# Patient Record
Sex: Female | Born: 1960 | Race: Black or African American | Hispanic: No | Marital: Single | Smoking: Former smoker
Health system: Southern US, Community
[De-identification: ages and names within clinical notes are randomized; demographics above are authoritative.]

---

## 2019-10-28 ENCOUNTER — Ambulatory Visit: Payer: Self-pay | Admitting: Internal Medicine

## 2019-12-11 ENCOUNTER — Other Ambulatory Visit: Payer: Self-pay

## 2019-12-11 ENCOUNTER — Encounter: Payer: Self-pay | Admitting: Internal Medicine

## 2019-12-11 ENCOUNTER — Ambulatory Visit (INDEPENDENT_AMBULATORY_CARE_PROVIDER_SITE_OTHER): Payer: 59 | Admitting: Internal Medicine

## 2019-12-11 DIAGNOSIS — D869 Sarcoidosis, unspecified: Secondary | ICD-10-CM

## 2019-12-11 NOTE — Patient Instructions (Signed)
Sarcoidosis ° °Sarcoidosis is a disease that can cause inflammation in many areas of the body. It most often affects the lungs (pulmonary sarcoidosis). Sarcoidosis can also affect the lymph nodes, liver, eyes, skin, heart, or any other body tissue. °Normally, cells that are part of your body's disease-fighting system (immune system) attack harmful substances (such as germs) in your body. This immune system response causes inflammation. After the harmful substance is destroyed, the inflammation and the immune cells go away. When you have sarcoidosis, your immune system causes inflammation even when there are no harmful substances, and the inflammation does not go away. Sarcoidosis also causes cells from your immune system to form small clumps of tissue (granulomas) in the affected area of your body. °What are the causes? °The exact cause of sarcoidosis is not known.  °It is possible that if you have a family history of this disease (genetic predisposition), the immune system response that leads to inflammation may be triggered by something in your environment, such as: °· Bacteria or viruses. °· Metals. °· Chemicals. °· Dust. °· Mold or mildew. °What increases the risk? °You may be at a greater risk for sarcoidosis if you: °· Have a family history of the disease. °· Are African-American. °· Are of Northern European descent. °· Are 20-50 years old. °· Work as a firefighter. °· Work in an environment where you are exposed to metals, chemicals, mold or mildew, or insecticides. °What are the signs or symptoms? °Some people with sarcoidosis have no symptoms. Others have very mild symptoms. The symptoms usually depend on the organ that is affected. Sarcoidosis most often affects the lungs, which may include symptoms such as: °· Chest pain. °· Coughing. °· Wheezing. °· Shortness of breath. °Other common symptoms include: °· Night sweats. °· Fever. °· Weight loss. °· Fatigue. °· Swollen lymph nodes. °· Joint pain. °How is  this diagnosed? °Sarcoidosis may be diagnosed based on: °· Your symptoms and medical history. °· A physical exam. °· Imaging tests to check for granulomas such as: °? Chest X-ray. °? CT scan. °? MRI. °? PET scan. °· Lung function tests. These tests evaluate your breathing and check for problems that may be related to sarcoidosis. °· A procedure to remove a tissue sample for testing (biopsy). You may have a biopsy of lung tissue if that is where you are having symptoms. °You may have tests to check for any complications of the condition. These tests may include: °· Eye exams. °· MRI of the heart or brain. °· Echocardiogram. °· Electrocardiogram (EKG or ECG). °How is this treated? °In some cases, sarcoidosis does not require a specific treatment because it causes no symptoms or mild symptoms. If your symptoms bother you or are severe, you may be prescribed medicines to reduce inflammation or relieve symptoms. These medicines may include: °· Prednisone. This is a steroid that reduces inflammation related to sarcoidosis. °· Hydroxychloroquine. This may be used to treat sarcoidosis that affects the skin, eyes, or brain. °· Methotrexate, leflunomide, or azathioprine. These medicines affect the immune system and can help with sarcoidosis in the joints, eyes, skin, or lungs. °· Medicines that you breathe in (inhalers). Inhalers can help you breathe if sarcoidosis affects your lungs. °Follow these instructions at home: ° °· Do not use any products that contain nicotine or tobacco, such as cigarettes and e-cigarettes. If you need help quitting, ask your health care provider. °· Avoid secondhand smoke and irritating dust or chemicals. Stay indoors on days when air quality is poor   in your area. °· Return to your normal activities as told by your health care provider. Ask your health care provider what activities are safe for you. °· Take or use over-the-counter and prescription medicines only as told by your health care  provider. °· Keep all follow-up visits as told by your health care provider. This is important. °Contact a health care provider if: °· You have vision problems. °· You have a dry cough that does not go away. °· You have an irregular heartbeat. °· You have pain or aches in your joints, hands, or feet. °· You have an unexplained rash. °Get help right away if: °· You have chest pain. °· You have difficulty breathing. °Summary °· Sarcoidosis is a disease that can cause inflammation in many body areas of the body. It most often affects the lungs (pulmonary sarcoidosis). It can also affect the lymph nodes, liver, eyes, skin, heart, or any other body tissue. °· When you have sarcoidosis, cells from your immune system form small clumps of tissue (granulomas) in the affected area of your body. °· Sarcoidosis sometimes does not require a specific treatment because it causes no symptoms or mild symptoms. °· If your symptoms bother you or are severe, you may be prescribed medicines to reduce inflammation or relieve symptoms. °This information is not intended to replace advice given to you by your health care provider. Make sure you discuss any questions you have with your health care provider. °Document Revised: 03/30/2017 Document Reviewed: 01/23/2017 °Elsevier Patient Education © 2020 [employer] ° °

## 2019-12-11 NOTE — Progress Notes (Signed)
HPI  Pt presents to the clinic today to establish care. She has not had a PCP in many years.  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 Managed by topical steroids and oral steroids. She has seen a dermatologist in the past for them.  Flu: never Tetanus: < 10 years ago Shingirx: never Covid: Moderna Pap Smear: > 5 years ago Mammogram: never Bone Density: never Colon Screening  History reviewed. No pertinent past medical history.  Current Outpatient Medications  Medication Sig Dispense Refill  . Biotin 78469 MCG TABS Take by mouth.    . COLLAGEN PO Take by mouth.    . Multiple Vitamin (MULTIVITAMIN) tablet Take 1 tablet by mouth daily.    . Omega 3 1200 MG CAPS Take by mouth.    . Turmeric (QC TUMERIC COMPLEX) 500 MG CAPS Take by mouth.     No current facility-administered medications for this visit.    Not on File  History reviewed. No pertinent family history.  Social History   Socioeconomic History  . Marital status: Single    Spouse name: Not on file  . Number of children: Not on file  . Years of education: Not on file  . Highest education level: Not on file  Occupational History  . Not on file  Tobacco Use  . Smoking status: Former Games developer  . Smokeless tobacco: Never Used  . Tobacco comment: quit 1999  Substance and Sexual Activity  . Alcohol use: Yes    Comment: occasional  . Drug use: Never  . Sexual activity: Not on file  Other Topics Concern  . Not on file  Social History Narrative  . Not on file   Social Determinants of Health   Financial Resource Strain:   . Difficulty of Paying Living Expenses:   Food Insecurity:   . Worried About Programme researcher, broadcasting/film/video in the Last Year:   . Barista in the Last Year:   Transportation Needs:   . Freight forwarder (Medical):   Marland Kitchen Lack of Transportation (Non-Medical):   Physical Activity:   . Days of Exercise per Week:   . Minutes of Exercise per Session:   Stress:   . Feeling of Stress  :   Social Connections:   . Frequency of Communication with Friends and Family:   . Frequency of Social Gatherings with Friends and Family:   . Attends Religious Services:   . Active Member of Clubs or Organizations:   . Attends Banker Meetings:   Marland Kitchen Marital Status:   Intimate Partner Violence:   . Fear of Current or Ex-Partner:   . Emotionally Abused:   Marland Kitchen Physically Abused:   . Sexually Abused:     ROS:  Constitutional: Denies fever, malaise, fatigue, headache or abrupt weight changes.  HEENT: Denies eye pain, eye redness, ear pain, ringing in the ears, wax buildup, runny nose, nasal congestion, bloody nose, or sore throat. Respiratory: Denies difficulty breathing, shortness of breath, cough or sputum production.   Cardiovascular: Denies chest pain, chest tightness, palpitations or swelling in the hands or feet.  Gastrointestinal: Denies abdominal pain, bloating, constipation, diarrhea or blood in the stool.  GU: Denies frequency, urgency, pain with urination, blood in urine, odor or discharge. Musculoskeletal: Denies decrease in range of motion, difficulty with gait, muscle pain or joint pain and swelling.  Skin: Denies redness, rashes, lesions or ulcercations.  Neurological: Denies dizziness, difficulty with memory, difficulty with speech or problems with balance and coordination.  Psych: Denies anxiety, depression, SI/HI.  No other specific complaints in a complete review of systems (except as listed in HPI above).  PE:  BP 116/78   Pulse 92   Temp 98.3 F (36.8 C) (Temporal)   Wt 121 lb (54.9 kg)   SpO2 97%  Wt Readings from Last 3 Encounters:  12/11/19 121 lb (54.9 kg)    General: Appears her stated age, well developed, well nourished in NAD. HEENT: Head: normal shape and size; Eyes: sclera white, no icterus, conjunctiva pink, PERRLA and EOMs intact;  Neck: Neck supple, trachea midline. No masses, lumps or thyromegaly present.  Cardiovascular: Normal  rate and rhythm. S1,S2 noted.  No murmur, rubs or gallops noted. No JVD or BLE edema.  Pulmonary/Chest: Normal effort and positive vesicular breath sounds. No respiratory distress. No wheezes, rales or ronchi noted.  Musculoskeletal: No difficulty with gait.  Neurological: Alert and oriented.  Psychiatric: Mood and affect normal. Behavior is normal. Judgment and thought content normal.     Assessment and Plan:

## 2019-12-11 NOTE — Assessment & Plan Note (Signed)
In remission Will monitor 

## 2020-02-05 ENCOUNTER — Encounter: Payer: 59 | Admitting: Internal Medicine

## 2020-04-13 ENCOUNTER — Ambulatory Visit (INDEPENDENT_AMBULATORY_CARE_PROVIDER_SITE_OTHER): Payer: 59 | Admitting: Internal Medicine

## 2020-04-13 ENCOUNTER — Encounter: Payer: Self-pay | Admitting: Internal Medicine

## 2020-04-13 ENCOUNTER — Other Ambulatory Visit (INDEPENDENT_AMBULATORY_CARE_PROVIDER_SITE_OTHER): Payer: 59

## 2020-04-13 ENCOUNTER — Other Ambulatory Visit (HOSPITAL_COMMUNITY)
Admission: RE | Admit: 2020-04-13 | Discharge: 2020-04-13 | Disposition: A | Discharge status: Home / Self Care | Payer: 59 | Source: Ambulatory Visit | Attending: Internal Medicine | Admitting: Internal Medicine

## 2020-04-13 ENCOUNTER — Other Ambulatory Visit: Payer: Self-pay

## 2020-04-13 VITALS — BP 120/80 | HR 90 | Temp 97.7°F | Ht 64.0 in | Wt 117.0 lb

## 2020-04-13 DIAGNOSIS — Z0000 Encounter for general adult medical examination without abnormal findings: Secondary | ICD-10-CM

## 2020-04-13 DIAGNOSIS — Z1159 Encounter for screening for other viral diseases: Secondary | ICD-10-CM | POA: Diagnosis not present

## 2020-04-13 DIAGNOSIS — Z1231 Encounter for screening mammogram for malignant neoplasm of breast: Secondary | ICD-10-CM | POA: Diagnosis not present

## 2020-04-13 DIAGNOSIS — Z124 Encounter for screening for malignant neoplasm of cervix: Secondary | ICD-10-CM | POA: Insufficient documentation

## 2020-04-13 DIAGNOSIS — Z1211 Encounter for screening for malignant neoplasm of colon: Secondary | ICD-10-CM

## 2020-04-13 DIAGNOSIS — R739 Hyperglycemia, unspecified: Secondary | ICD-10-CM

## 2020-04-13 DIAGNOSIS — Z113 Encounter for screening for infections with a predominantly sexual mode of transmission: Secondary | ICD-10-CM | POA: Diagnosis present

## 2020-04-13 DIAGNOSIS — Z114 Encounter for screening for human immunodeficiency virus [HIV]: Secondary | ICD-10-CM

## 2020-04-13 DIAGNOSIS — Z780 Asymptomatic menopausal state: Secondary | ICD-10-CM

## 2020-04-13 LAB — COMPREHENSIVE METABOLIC PANEL
ALT: 12 U/L (ref 0–35)
AST: 14 U/L (ref 0–37)
Albumin: 4.2 g/dL (ref 3.5–5.2)
Alkaline Phosphatase: 74 U/L (ref 39–117)
BUN: 7 mg/dL (ref 6–23)
CO2: 27 mEq/L (ref 19–32)
Calcium: 10 mg/dL (ref 8.4–10.5)
Chloride: 99 mEq/L (ref 96–112)
Creatinine, Ser: 0.87 mg/dL (ref 0.40–1.20)
GFR: 72.76 mL/min (ref >60.00)
Glucose, Bld: 404 mg/dL — ABNORMAL HIGH (ref 70–99)
Potassium: 3.7 mEq/L (ref 3.5–5.1)
Sodium: 135 mEq/L (ref 135–145)
Total Bilirubin: 0.9 mg/dL (ref 0.2–1.2)
Total Protein: 7.8 g/dL (ref 6.0–8.3)

## 2020-04-13 LAB — CBC
HCT: 43.1 % (ref 36.0–46.0)
Hemoglobin: 14.5 g/dL (ref 12.0–15.0)
MCHC: 33.7 g/dL (ref 30.0–36.0)
MCV: 92.9 fl (ref 78.0–100.0)
Platelets: 278 10*3/uL (ref 150.0–400.0)
RBC: 4.63 Mil/uL (ref 3.87–5.11)
RDW: 12.5 % (ref 11.5–15.5)
WBC: 8.8 10*3/uL (ref 4.0–10.5)

## 2020-04-13 LAB — LIPID PANEL
Cholesterol: 178 mg/dL (ref 0–200)
HDL: 66.2 mg/dL (ref >39.00)
LDL Cholesterol: 92 mg/dL (ref 0–99)
NonHDL: 111.76
Total CHOL/HDL Ratio: 3
Triglycerides: 97 mg/dL (ref 0.0–149.0)
VLDL: 19.4 mg/dL (ref 0.0–40.0)

## 2020-04-13 LAB — HEMOGLOBIN A1C: Hgb A1c MFr Bld: 15 % — ABNORMAL HIGH (ref 4.6–6.5)

## 2020-04-13 NOTE — Patient Instructions (Signed)

## 2020-04-13 NOTE — Progress Notes (Signed)
Subjective:    Patient ID: Laura Mayer, female    DOB: 1960/11/24, 59 y.o.   MRN: [id number]  HPI  Pt presents to the clinic today for her annual exam.  Flu: never Tetanus: < 10 years ago Covid: Moderna Shingrix: never Pap Smear: > 5 years Mammogram: never Bone Density: never Colon Screening: never Vision Screening: as needed Dentist: as needed  Diet: She does eat meat. She consumes fruits and veggies daily. She tries to avoid fried foods. She drinks mostly water. Exercise: None  Review of Systems      No past medical history on file.  Current Outpatient Medications  Medication Sig Dispense Refill  . Biotin 05397 MCG TABS Take by mouth.    . COLLAGEN PO Take by mouth.    . Multiple Vitamin (MULTIVITAMIN) tablet Take 1 tablet by mouth daily.    . Omega 3 1200 MG CAPS Take by mouth.    . Turmeric (QC TUMERIC COMPLEX) 500 MG CAPS Take by mouth.     No current facility-administered medications for this visit.    Not on File  Family History  Problem Relation Age of Onset  . Cancer Father        prostate    Social History   Socioeconomic History  . Marital status: Single    Spouse name: Not on file  . Number of children: Not on file  . Years of education: Not on file  . Highest education level: Not on file  Occupational History  . Not on file  Tobacco Use  . Smoking status: Former Games developer  . Smokeless tobacco: Never Used  . Tobacco comment: quit 1999  Substance and Sexual Activity  . Alcohol use: Yes    Comment: occasional  . Drug use: Never  . Sexual activity: Not on file  Other Topics Concern  . Not on file  Social History Narrative  . Not on file   Social Determinants of Health   Financial Resource Strain: Not on file  Food Insecurity: Not on file  Transportation Needs: Not on file  Physical Activity: Not on file  Stress: Not on file  Social Connections: Not on file  Intimate Partner Violence: Not on file     Constitutional: Denies  fever, malaise, fatigue, headache or abrupt weight changes.  HEENT: Denies eye pain, eye redness, ear pain, ringing in the ears, wax buildup, runny nose, nasal congestion, bloody nose, or sore throat. Respiratory: Denies difficulty breathing, shortness of breath, cough or sputum production.   Cardiovascular: Denies chest pain, chest tightness, palpitations or swelling in the hands or feet.  Gastrointestinal: Denies abdominal pain, bloating, constipation, diarrhea or blood in the stool.  GU: Denies urgency, frequency, pain with urination, burning sensation, blood in urine, odor or discharge. Musculoskeletal: Denies decrease in range of motion, difficulty with gait, muscle pain or joint pain and swelling.  Skin: Denies redness, rashes, lesions or ulcercations.  Neurological: Denies dizziness, difficulty with memory, difficulty with speech or problems with balance and coordination.  Psych: Denies anxiety, depression, SI/HI.  No other specific complaints in a complete review of systems (except as listed in HPI above).  Objective:   Physical Exam   BP 120/80   Pulse 90   Temp 97.7 F (36.5 C) (Temporal)   Ht 5\' 4"  (1.626 m)   Wt 117 lb (53.1 kg)   SpO2 98%   BMI 20.08 kg/m   Wt Readings from Last 3 Encounters:  12/11/19 121 lb (54.9 kg)  General: Appears her stated age, well developed, well nourished in NAD. Skin: Warm, dry and intact. No rashes noted. HEENT: Head: normal shape and size; Eyes: sclera white, no icterus, conjunctiva pink, PERRLA and EOMs intact;  Neck:  Neck supple, trachea midline. No masses, lumps or thyromegaly present.  Cardiovascular: Normal rate and rhythm. S1,S2 noted.  No murmur, rubs or gallops noted. No JVD or BLE edema. No carotid bruits noted. Pulmonary/Chest: Normal effort and positive vesicular breath sounds. No respiratory distress. No wheezes, rales or ronchi noted.  Abdomen: Soft and nontender. Normal bowel sounds. No distention or masses noted.  Liver, spleen and kidneys non palpable. Pelvic: Normal female anatomy. Cervix not well visualized. No discharge or odor noted. Adnexa nonpalpable. Musculoskeletal: Strength 5/5 BUE/BLE. No difficulty with gait.  Neurological: Alert and oriented. Cranial nerves II-XII grossly intact. Coordination normal.  Psychiatric: Mood and affect normal. Behavior is normal. Judgment and thought content normal.       Assessment & Plan:  Preventative Health Maintenance:  She declines flu shot today Tetanus UTD per her report Covid vaccine UTD She will consider Shingrix vaccine Pap smear today with STD screening Mammogram ordered- she will call to schedule Bone density ordered- she will call to schedule Referral to GI for screening colonoscopy Encouraged her to consume a balanced diet and exercise regimen Advised her to see an eye doctor and dentist annually Will check CBC, CMET, Lipid, HIV and Hep C today  RTC in 1 year, sooner if needed  Nicki Reaper, NP This visit occurred during the SARS-CoV-2 public health emergency.  Safety protocols were in place, including screening questions prior to the visit, additional usage of staff PPE, and extensive cleaning of exam room while observing appropriate contact time as indicated for disinfecting solutions.

## 2020-04-13 NOTE — Addendum Note (Signed)
Addended by: Roena Malady on: 04/13/2020 05:02 PM   Modules accepted: Orders

## 2020-04-14 LAB — HIV ANTIBODY (ROUTINE TESTING W REFLEX): HIV 1&2 Ab, 4th Generation: NONREACTIVE

## 2020-04-14 LAB — HEPATITIS C ANTIBODY
Hepatitis C Ab: NONREACTIVE
SIGNAL TO CUT-OFF: 0 (ref <1.00)

## 2020-04-16 ENCOUNTER — Encounter: Payer: Self-pay | Admitting: Internal Medicine

## 2020-04-16 LAB — CYTOLOGY - PAP
Chlamydia: NEGATIVE
Comment: NEGATIVE
Comment: NEGATIVE
Comment: NEGATIVE
Comment: NORMAL
Diagnosis: NEGATIVE
High risk HPV: NEGATIVE
Neisseria Gonorrhea: NEGATIVE
Trichomonas: NEGATIVE

## 2020-04-27 ENCOUNTER — Other Ambulatory Visit: Payer: Self-pay

## 2020-04-27 ENCOUNTER — Ambulatory Visit
Admission: RE | Admit: 2020-04-27 | Discharge: 2020-04-27 | Disposition: A | Discharge status: Home / Self Care | Payer: 59 | Source: Ambulatory Visit | Attending: Internal Medicine | Admitting: Internal Medicine

## 2020-04-27 DIAGNOSIS — Z1231 Encounter for screening mammogram for malignant neoplasm of breast: Secondary | ICD-10-CM

## 2020-04-27 DIAGNOSIS — Z780 Asymptomatic menopausal state: Secondary | ICD-10-CM

## 2020-04-29 ENCOUNTER — Other Ambulatory Visit: Payer: Self-pay

## 2020-04-29 ENCOUNTER — Encounter: Payer: Self-pay | Admitting: Internal Medicine

## 2020-04-29 ENCOUNTER — Ambulatory Visit (INDEPENDENT_AMBULATORY_CARE_PROVIDER_SITE_OTHER): Payer: 59 | Admitting: Internal Medicine

## 2020-04-29 VITALS — BP 122/72 | HR 79 | Temp 98.2°F | Wt 115.0 lb

## 2020-04-29 DIAGNOSIS — E1165 Type 2 diabetes mellitus with hyperglycemia: Secondary | ICD-10-CM

## 2020-04-29 LAB — GLUCOSE, POCT (MANUAL RESULT ENTRY): POC Glucose: 446 mg/dl — AB (ref 70–99)

## 2020-04-29 MED ORDER — METFORMIN HCL 1000 MG PO TABS: ORAL_TABLET | ORAL | 2 refills | Status: DC

## 2020-04-29 NOTE — Progress Notes (Signed)
Subjective:    Patient ID: Laura Mayer, female    DOB: 1960-05-06, 59 y.o.   MRN: [id number]  HPI  Pt presents to the clinic today for follow up labs. Recent A1C 15%, no prior history of DM2. Her LDL is 92.   Review of Systems  No past medical history on file.  Current Outpatient Medications  Medication Sig Dispense Refill  . Biotin 87867 MCG TABS Take by mouth.    . COLLAGEN PO Take by mouth.    . Multiple Vitamin (MULTIVITAMIN) tablet Take 1 tablet by mouth daily.    . Omega 3 1200 MG CAPS Take by mouth.    . Turmeric 500 MG CAPS Take by mouth.     No current facility-administered medications for this visit.    No Known Allergies  Family History  Problem Relation Age of Onset  . Cancer Father        prostate    Social History   Socioeconomic History  . Marital status: Single    Spouse name: Not on file  . Number of children: Not on file  . Years of education: Not on file  . Highest education level: Not on file  Occupational History  . Not on file  Tobacco Use  . Smoking status: Former Games developer  . Smokeless tobacco: Never Used  . Tobacco comment: quit 1999  Substance and Sexual Activity  . Alcohol use: Yes    Comment: occasional  . Drug use: Never  . Sexual activity: Not on file  Other Topics Concern  . Not on file  Social History Narrative  . Not on file   Social Determinants of Health   Financial Resource Strain: Not on file  Food Insecurity: Not on file  Transportation Needs: Not on file  Physical Activity: Not on file  Stress: Not on file  Social Connections: Not on file  Intimate Partner Violence: Not on file     Constitutional: Denies fever, malaise, fatigue, headache or abrupt weight changes.  Respiratory: Denies difficulty breathing, shortness of breath, cough or sputum production.   Cardiovascular: Denies chest pain, chest tightness, palpitations or swelling in the hands or feet.  Gastrointestinal: Denies abdominal pain, bloating,  constipation, diarrhea or blood in the stool.  Skin: Denies redness, rashes, lesions or ulcercations.  Neurological: Denies dizziness, difficulty with memory, difficulty with speech or problems with balance and coordination.    No other specific complaints in a complete review of systems (except as listed in HPI above).     Objective:   Physical Exam   BP 122/72   Pulse 79   Temp 98.2 F (36.8 C) (Temporal)   Wt 115 lb (52.2 kg)   SpO2 97%   BMI 19.74 kg/m   Wt Readings from Last 3 Encounters:  04/13/20 117 lb (53.1 kg)  12/11/19 121 lb (54.9 kg)    General: Appears her stated age, well developed, well nourished in NAD. Skin: Warm, dry and intact. No ulcerations noted. Cardiovascular: Normal rate. Pulmonary/Chest: Normal effort. Neurological: Alert and oriented.    BMET    Component Value Date/Time   NA 135 04/13/2020 0958   K 3.7 04/13/2020 0958   CL 99 04/13/2020 0958   CO2 27 04/13/2020 0958   GLUCOSE 404 (H) 04/13/2020 0958   BUN 7 04/13/2020 0958   CREATININE 0.87 04/13/2020 0958   CALCIUM 10.0 04/13/2020 0958    Lipid Panel     Component Value Date/Time   CHOL 178 04/13/2020 0958  TRIG 97.0 04/13/2020 0958   HDL 66.20 04/13/2020 0958   CHOLHDL 3 04/13/2020 0958   VLDL 19.4 04/13/2020 0958   LDLCALC 92 04/13/2020 0958    CBC    Component Value Date/Time   WBC 8.8 04/13/2020 0958   RBC 4.63 04/13/2020 0958   HGB 14.5 04/13/2020 0958   HCT 43.1 04/13/2020 0958   PLT 278.0 04/13/2020 0958   MCV 92.9 04/13/2020 0958   MCHC 33.7 04/13/2020 0958   RDW 12.5 04/13/2020 0958    Hgb A1C Lab Results  Component Value Date   HGBA1C 15.0 (H) 04/13/2020           Assessment & Plan:    Nicki Reaper, NP This visit occurred during the SARS-CoV-2 public health emergency.  Safety protocols were in place, including screening questions prior to the visit, additional usage of staff PPE, and extensive cleaning of exam room while observing appropriate  contact time as indicated for disinfecting solutions.

## 2020-04-29 NOTE — Patient Instructions (Signed)
Diabetes Mellitus and Standards of Medical Care Managing diabetes (diabetes mellitus) can be complicated. Your diabetes treatment may be managed by a team of health care providers, including:  A physician who specializes in diabetes (endocrinologist).  A nurse practitioner or physician assistant.  Nurses.  A diet and nutrition specialist (registered dietitian).  A certified diabetes educator (CDE).  An exercise specialist.  A pharmacist.  An eye doctor.  A foot specialist (podiatrist).  A dentist.  A primary care provider.  A mental health provider. Your health care providers follow guidelines to help you get the best quality of care. The following schedule is a general guideline for your diabetes management plan. Your health care providers may give you more specific instructions. Physical exams Upon being diagnosed with diabetes mellitus, and each year after that, your health care provider will ask about your medical and family history. He or she will also do a physical exam. Your exam may include:  Measuring your height, weight, and body mass index (BMI).  Checking your blood pressure. This will be done at every routine medical visit. Your target blood pressure may vary depending on your medical conditions, your age, and other factors.  Thyroid gland exam.  Skin exam.  Screening for damage to your nerves (peripheral neuropathy). This may include checking the pulse in your legs and feet and checking the level of sensation in your hands and feet.  A complete foot exam to inspect the structure and skin of your feet, including checking for cuts, bruises, redness, blisters, sores, or other problems.  Screening for blood vessel (vascular) problems, which may include checking the pulse in your legs and feet and checking your temperature. Blood tests Depending on your treatment plan and your personal needs, you may have the following tests done:  HbA1c (hemoglobin A1c). This  test provides information about blood sugar (glucose) control over the previous 2-3 months. It is used to adjust your treatment plan, if needed. This test will be done: ? At least 2 times a year, if you are meeting your treatment goals. ? 4 times a year, if you are not meeting your treatment goals or if treatment goals have changed.  Lipid testing, including total, LDL, and HDL cholesterol and triglyceride levels. ? The goal for LDL is less than 100 mg/dL (5.5 mmol/L). If you are at high risk for complications, the goal is less than 70 mg/dL (3.9 mmol/L). ? The goal for HDL is 40 mg/dL (2.2 mmol/L) or higher for men and 50 mg/dL (2.8 mmol/L) or higher for women. An HDL cholesterol of 60 mg/dL (3.3 mmol/L) or higher gives some protection against heart disease. ? The goal for triglycerides is less than 150 mg/dL (8.3 mmol/L).  Liver function tests.  Kidney function tests.  Thyroid function tests. Dental and eye exams  Visit your dentist two times a year.  If you have type 1 diabetes, your health care provider may recommend an eye exam 3-5 years after you are diagnosed, and then once a year after your first exam. ? For children with type 1 diabetes, a health care provider may recommend an eye exam when your child is age 10 or older and has had diabetes for 3-5 years. After the first exam, your child should get an eye exam once a year.  If you have type 2 diabetes, your health care provider may recommend an eye exam as soon as you are diagnosed, and then once a year after your first exam. Immunizations   The   yearly flu (influenza) vaccine is recommended for everyone 6 months or older who has diabetes.  The pneumonia (pneumococcal) vaccine is recommended for everyone 2 years or older who has diabetes. If you are 65 or older, you may get the pneumonia vaccine as a series of two separate shots.  The hepatitis B vaccine is recommended for adults shortly after being diagnosed with  diabetes.  Adults and children with diabetes should receive all other vaccines according to age-specific recommendations from the Centers for Disease Control and Prevention (CDC). Mental and emotional health Screening for symptoms of eating disorders, anxiety, and depression is recommended at the time of diagnosis and afterward as needed. If your screening shows that you have symptoms (positive screening result), you may need more evaluation and you may work with a mental health care provider. Treatment plan Your treatment plan will be reviewed at every medical visit. You and your health care provider will discuss:  How you are taking your medicines, including insulin.  Any side effects you are experiencing.  Your blood glucose target goals.  The frequency of your blood glucose monitoring.  Lifestyle habits, such as activity level as well as tobacco, alcohol, and substance use. Diabetes self-management education Your health care provider will assess how well you are monitoring your blood glucose levels and whether you are taking your insulin correctly. He or she may refer you to:  A certified diabetes educator to manage your diabetes throughout your life, starting at diagnosis.  A registered dietitian who can create or review your personal nutrition plan.  An exercise specialist who can discuss your activity level and exercise plan. Summary  Managing diabetes (diabetes mellitus) can be complicated. Your diabetes treatment may be managed by a team of health care providers.  Your health care providers follow guidelines in order to help you get the best quality of care.  Standards of care including having regular physical exams, blood tests, blood pressure monitoring, immunizations, screening tests, and education about how to manage your diabetes.  Your health care providers may also give you more specific instructions based on your individual health. This information is not intended  to replace advice given to you by your health care provider. Make sure you discuss any questions you have with your health care provider. Document Revised: 01/04/2018 Document Reviewed: 01/14/2016 Elsevier Patient Education  2020 Elsevier Inc.  

## 2020-04-29 NOTE — Assessment & Plan Note (Signed)
CBG 446 today Discussed diabetes and standards of medical care Diabetes diet handout given She will work on low carb diet She would like to avoid insulin if at all possible RX for Metformin 1000 mg BID  RTC in 3 months for lab only A1C

## 2020-07-19 ENCOUNTER — Encounter: Payer: Self-pay | Admitting: Internal Medicine

## 2020-07-29 ENCOUNTER — Ambulatory Visit (INDEPENDENT_AMBULATORY_CARE_PROVIDER_SITE_OTHER): Payer: Self-pay | Admitting: Internal Medicine

## 2020-07-29 ENCOUNTER — Other Ambulatory Visit: Payer: Self-pay

## 2020-07-29 ENCOUNTER — Encounter: Payer: Self-pay | Admitting: Internal Medicine

## 2020-07-29 VITALS — BP 118/72 | HR 97 | Temp 97.1°F | Wt 111.0 lb

## 2020-07-29 DIAGNOSIS — E1165 Type 2 diabetes mellitus with hyperglycemia: Secondary | ICD-10-CM

## 2020-07-29 LAB — POCT GLYCOSYLATED HEMOGLOBIN (HGB A1C): Hemoglobin A1C: 13.4 % — AB (ref 4.0–5.6)

## 2020-07-29 MED ORDER — GLIPIZIDE 10 MG PO TABS
10.0000 mg | ORAL_TABLET | Freq: Two times a day (BID) | ORAL | 2 refills | Status: DC
Start: 2020-07-29 — End: 2022-10-06

## 2020-07-29 NOTE — Assessment & Plan Note (Signed)
POCT A1C 13.4% She declines urine microalbumin due to insurance issues Continue Metformin 1000 mg BID RX for Glipizide 10 mg BID I wanted to start insulin, but she declined Offered referral to diabetes education and nutrition, she declines Encouraged low carb diet, aerobic exercise Encouraged routine eye exam Encouraged routine foot exam She declines immunizations

## 2020-07-29 NOTE — Patient Instructions (Signed)

## 2020-07-29 NOTE — Progress Notes (Signed)
Subjective:    Patient ID: Laura Mayer, female    DOB: Mar 16, 1961, 60 y.o.   MRN: [id number]  HPI  Patient presents to the clinic today for follow-up of DM 2.  Her last A1c was 15%, 03/2020.  She was instructed on lifestyle changes and started on Metformin at 1000 mg twice daily.  She has been taking the medication as prescribed.  She has not been checking her sugars.  Her BP today is 118/72.  Her last LDL was 92, 03/2020.  Flu never.  Pneumovax never.  Covid never.   Review of Systems  No past medical history on file.  Current Outpatient Medications  Medication Sig Dispense Refill  . Biotin 93716 MCG TABS Take by mouth.    . COLLAGEN PO Take by mouth.    . metFORMIN (GLUCOPHAGE) 1000 MG tablet Take 0.5 tablets (500 mg total) by mouth daily with breakfast for 7 days, THEN 0.5 tablets (500 mg total) 2 (two) times daily with a meal for 7 days, THEN 1 tablet (1,000 mg total) 2 (two) times daily with a meal. 180 tablet 2  . Multiple Vitamin (MULTIVITAMIN) tablet Take 1 tablet by mouth daily.    . Omega 3 1200 MG CAPS Take by mouth.    . Turmeric 500 MG CAPS Take by mouth.     No current facility-administered medications for this visit.    No Known Allergies  Family History  Problem Relation Age of Onset  . Cancer Father        prostate    Social History   Socioeconomic History  . Marital status: Single    Spouse name: Not on file  . Number of children: Not on file  . Years of education: Not on file  . Highest education level: Not on file  Occupational History  . Not on file  Tobacco Use  . Smoking status: Former Games developer  . Smokeless tobacco: Never Used  . Tobacco comment: quit 1999  Substance and Sexual Activity  . Alcohol use: Yes    Comment: occasional  . Drug use: Never  . Sexual activity: Not on file  Other Topics Concern  . Not on file  Social History Narrative  . Not on file   Social Determinants of Health   Financial Resource Strain: Not on file   Food Insecurity: Not on file  Transportation Needs: Not on file  Physical Activity: Not on file  Stress: Not on file  Social Connections: Not on file  Intimate Partner Violence: Not on file     Constitutional: Denies fever, malaise, fatigue, headache or abrupt weight changes.  HEENT: Denies eye pain, eye redness, ear pain, ringing in the ears, wax buildup, runny nose, nasal congestion, bloody nose, or sore throat. Respiratory: Denies difficulty breathing, shortness of breath, cough or sputum production.   Cardiovascular: Denies chest pain, chest tightness, palpitations or swelling in the hands or feet.  Gastrointestinal: Denies abdominal pain, bloating, constipation, diarrhea or blood in the stool.  GU: Denies urgency, frequency, pain with urination, burning sensation, blood in urine, odor or discharge. Skin: Denies redness, rashes, lesions or ulcercations.  Neurological: Denies dizziness, difficulty with memory, difficulty with speech or problems with balance and coordination.   No other specific complaints in a complete review of systems (except as listed in HPI above).     Objective:   Physical Exam  BP 118/72   Pulse 97   Temp (!) 97.1 F (36.2 C) (Temporal)   Wt 111 lb (  50.3 kg)   SpO2 98%   BMI 19.05 kg/m   Wt Readings from Last 3 Encounters:  04/29/20 115 lb (52.2 kg)  04/13/20 117 lb (53.1 kg)  12/11/19 121 lb (54.9 kg)    General: Appears her stated age, well developed, well nourished in NAD. Skin: Warm, dry and intact. No ulcerations noted. HEENT: Head: normal shape and size; Eyes: sclera white, no icterus, conjunctiva pink, PERRLA and EOMs intact;  Cardiovascular: Normal rate. Pulmonary/Chest: Normal effort. Neurological: Alert and oriented.   BMET    Component Value Date/Time   NA 135 04/13/2020 0958   K 3.7 04/13/2020 0958   CL 99 04/13/2020 0958   CO2 27 04/13/2020 0958   GLUCOSE 404 (H) 04/13/2020 0958   BUN 7 04/13/2020 0958   CREATININE 0.87  04/13/2020 0958   CALCIUM 10.0 04/13/2020 0958    Lipid Panel     Component Value Date/Time   CHOL 178 04/13/2020 0958   TRIG 97.0 04/13/2020 0958   HDL 66.20 04/13/2020 0958   CHOLHDL 3 04/13/2020 0958   VLDL 19.4 04/13/2020 0958   LDLCALC 92 04/13/2020 0958    CBC    Component Value Date/Time   WBC 8.8 04/13/2020 0958   RBC 4.63 04/13/2020 0958   HGB 14.5 04/13/2020 0958   HCT 43.1 04/13/2020 0958   PLT 278.0 04/13/2020 0958   MCV 92.9 04/13/2020 0958   MCHC 33.7 04/13/2020 0958   RDW 12.5 04/13/2020 0958    Hgb A1C Lab Results  Component Value Date   HGBA1C 15.0 (H) 04/13/2020           Assessment & Plan:    Nicki Reaper, NP This visit occurred during the SARS-CoV-2 public health emergency.  Safety protocols were in place, including screening questions prior to the visit, additional usage of staff PPE, and extensive cleaning of exam room while observing appropriate contact time as indicated for disinfecting solutions.

## 2021-04-14 ENCOUNTER — Encounter: Payer: 59 | Admitting: Internal Medicine

## 2021-08-02 IMAGING — MG DIGITAL SCREENING BILAT W/ TOMO W/ CAD
8 series · 9 of 24 positions shown · non-contrast
Comparison: None.

CLINICAL DATA: Screening.

EXAM:
DIGITAL SCREENING BILATERAL MAMMOGRAM WITH TOMO AND CAD

[R CC synth-2D]
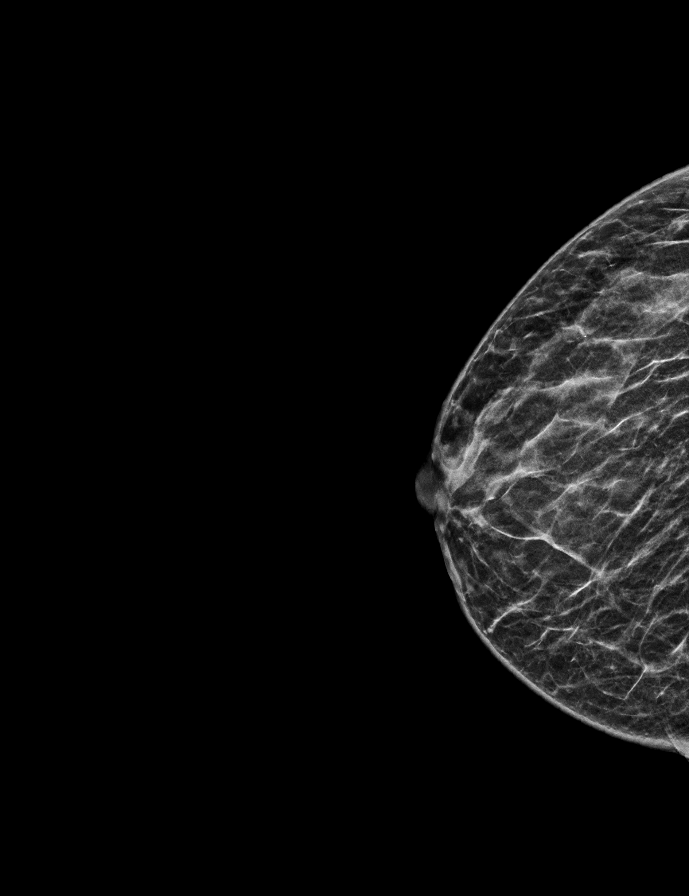

[L MLO synth-2D]
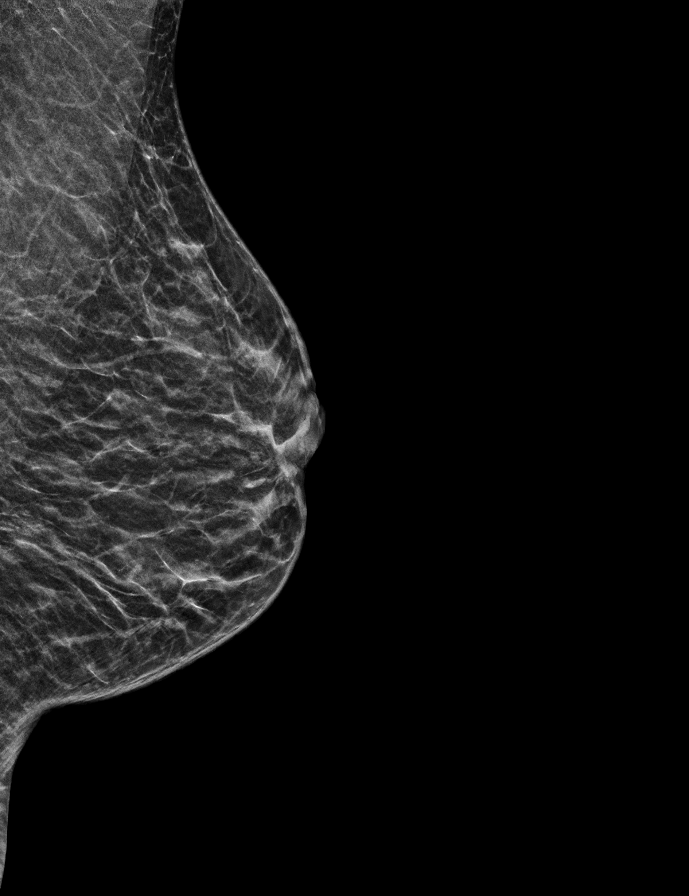

[L CC synth-2D]
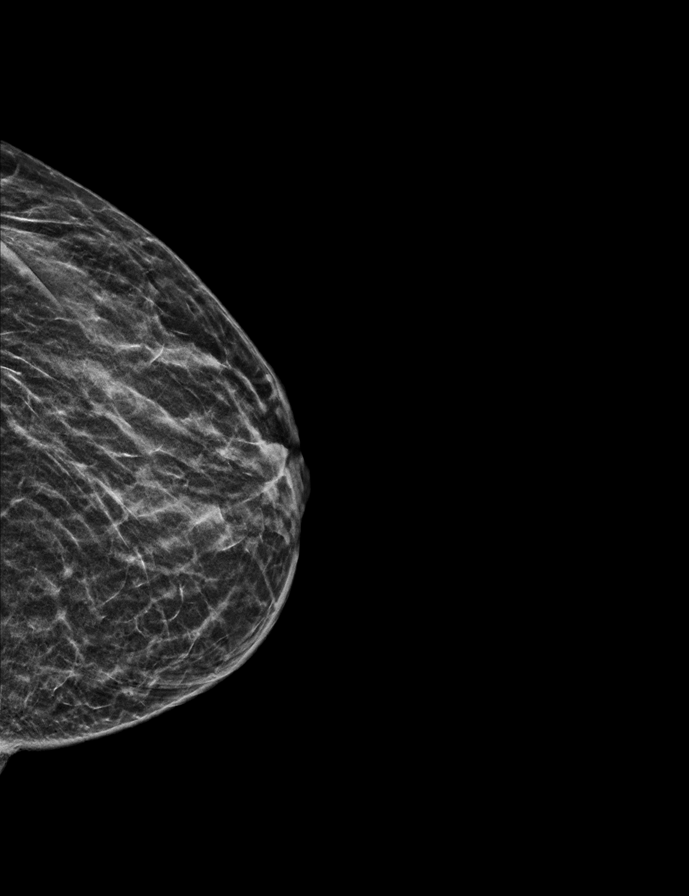

[R MLO synth-2D]
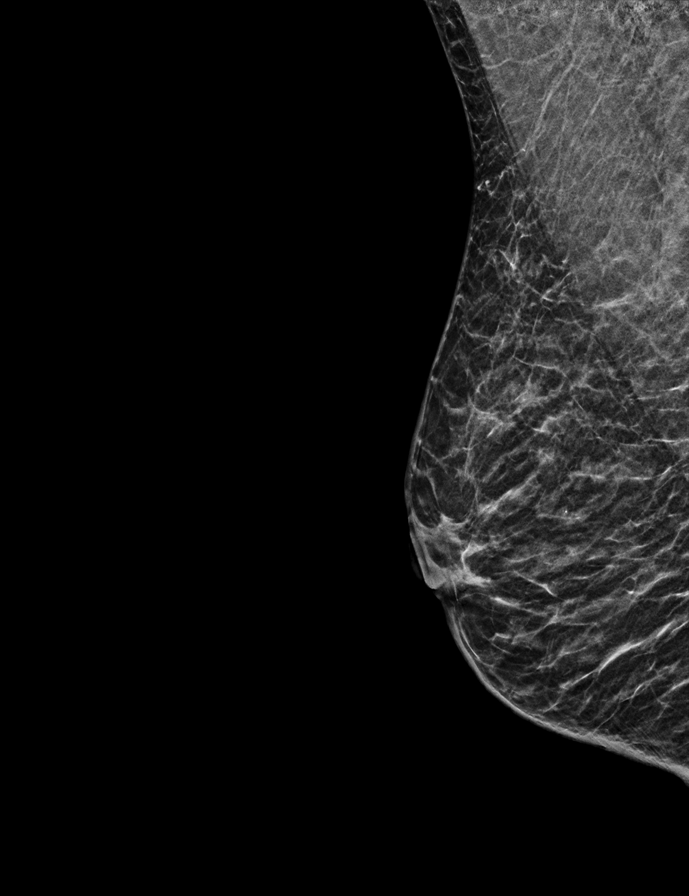

[R MLO tomo · 2 of 25 frames shown]
[frame 9/25]
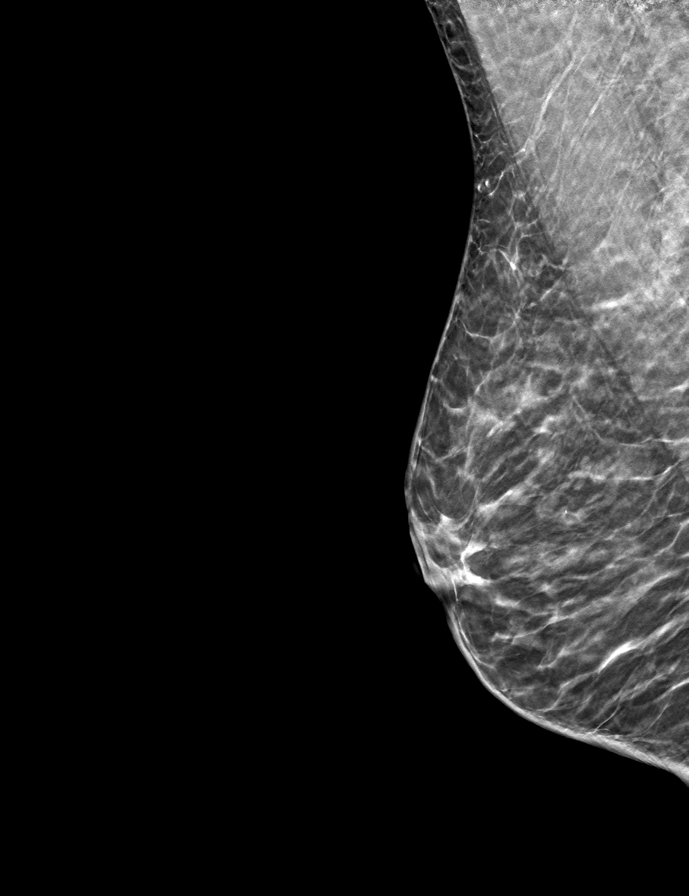
[frame 13/25]
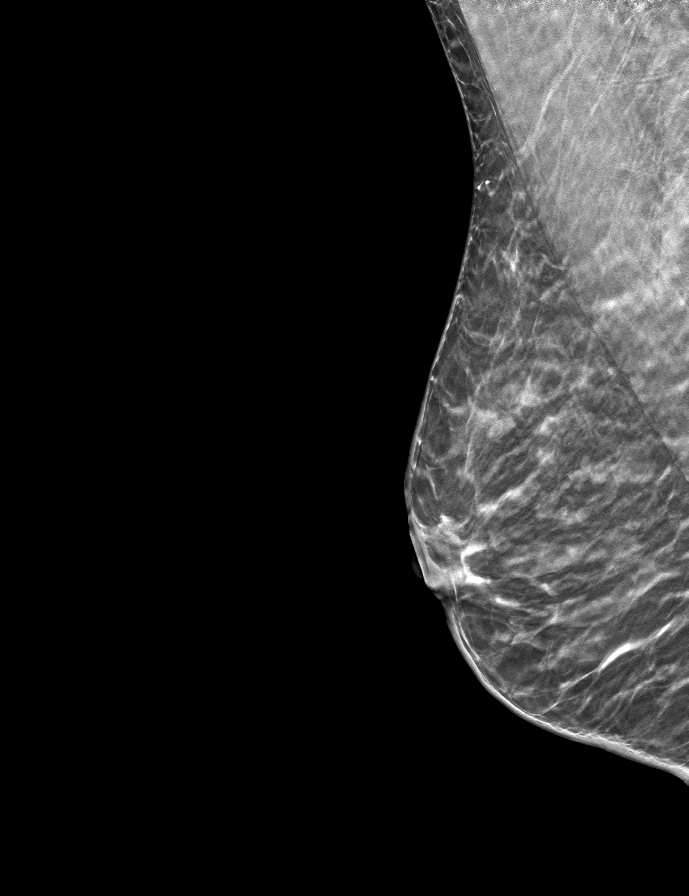

[L CC tomo · tomo slice 14/27.0]
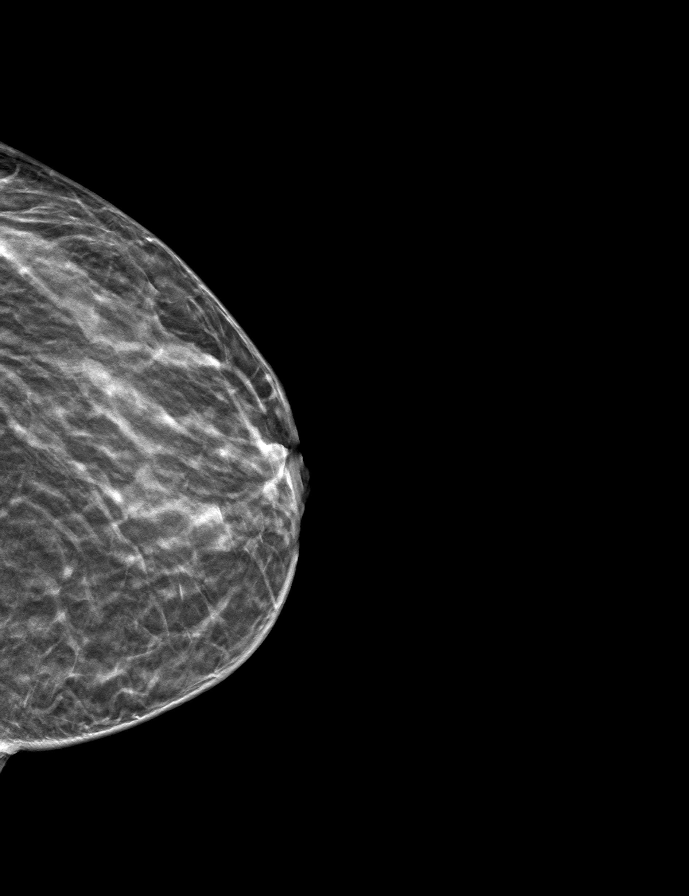

[L MLO tomo · tomo slice 15/30.0]
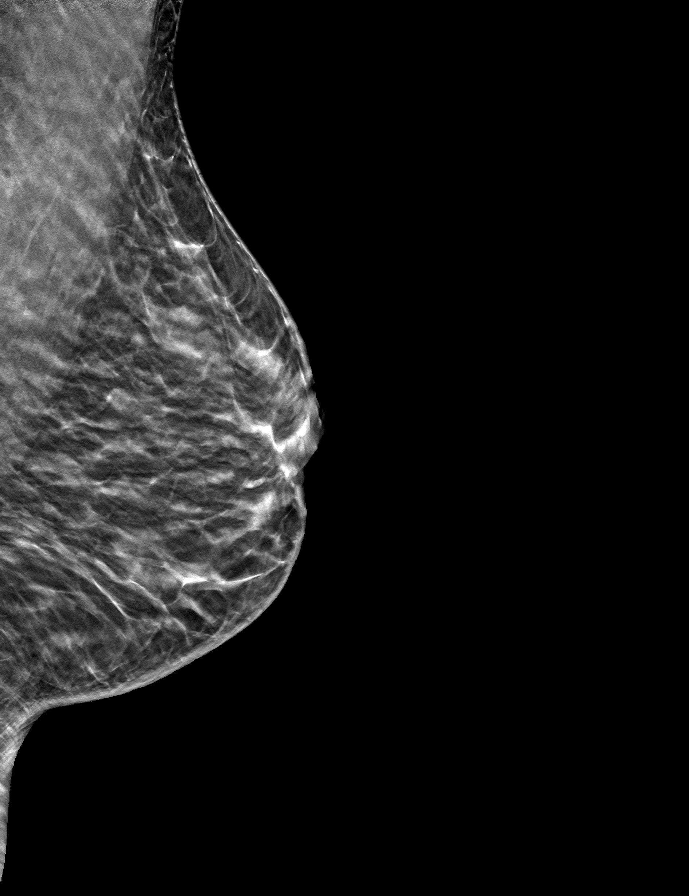

[R CC tomo · tomo slice 15/29.0]
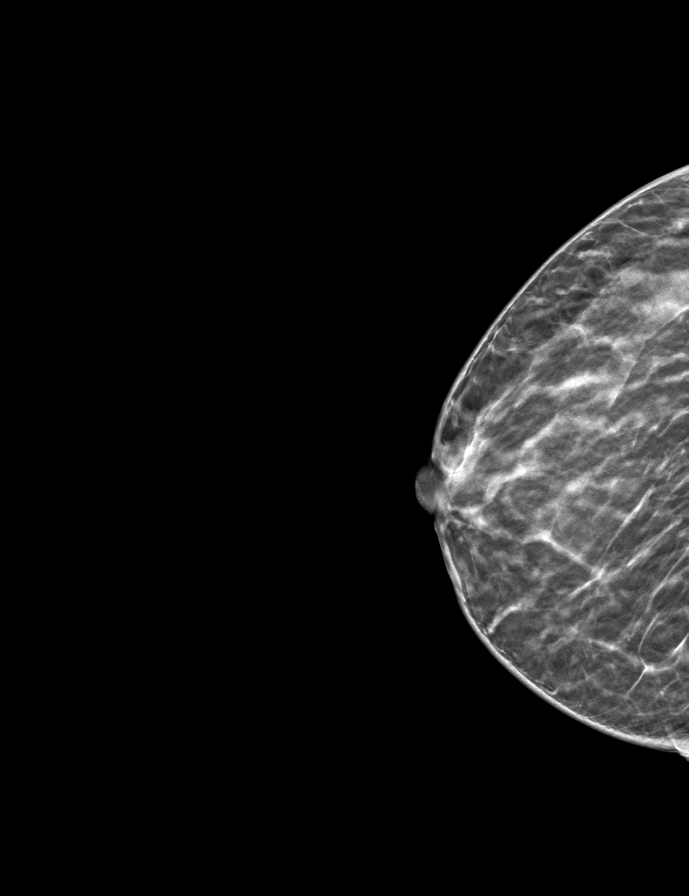

[9 of 24 positions shown; findings below may reference images not displayed]

ACR Breast Density Category b: There are scattered areas of
fibroglandular density.
FINDINGS: There are no findings suspicious for malignancy. Images were
processed with CAD.
IMPRESSION: No mammographic evidence of malignancy. A result letter of this
screening mammogram will be mailed directly to the patient.

RECOMMENDATION:
Screening mammogram in one year. (Code:Y5-G-EJ6)

BI-RADS CATEGORY  1: Negative.

## 2022-10-06 ENCOUNTER — Encounter: Payer: Self-pay | Admitting: Physician Assistant

## 2022-10-06 ENCOUNTER — Ambulatory Visit: Payer: Medicaid Other | Admitting: Physician Assistant

## 2022-10-06 VITALS — BP 110/70 | HR 109 | Temp 98.3°F | Resp 16 | Ht 63.0 in | Wt 92.8 lb

## 2022-10-06 DIAGNOSIS — R634 Abnormal weight loss: Secondary | ICD-10-CM | POA: Diagnosis not present

## 2022-10-06 DIAGNOSIS — E782 Mixed hyperlipidemia: Secondary | ICD-10-CM | POA: Diagnosis not present

## 2022-10-06 DIAGNOSIS — R946 Abnormal results of thyroid function studies: Secondary | ICD-10-CM | POA: Diagnosis not present

## 2022-10-06 DIAGNOSIS — Z7689 Persons encountering health services in other specified circumstances: Secondary | ICD-10-CM

## 2022-10-06 DIAGNOSIS — Z1231 Encounter for screening mammogram for malignant neoplasm of breast: Secondary | ICD-10-CM

## 2022-10-06 DIAGNOSIS — E1165 Type 2 diabetes mellitus with hyperglycemia: Secondary | ICD-10-CM

## 2022-10-06 DIAGNOSIS — D869 Sarcoidosis, unspecified: Secondary | ICD-10-CM

## 2022-10-06 NOTE — Progress Notes (Unsigned)
[facility] [address] [place], [place] [address]  Internal MEDICINE  Office Visit Note  Patient Name: Laura Mayer  [id number]  [id number]  Date of Service: 10/06/2022   Complaints/HPI Pt is here for establishment of PCP. Chief Complaint  Patient presents with   New Patient (Initial Visit)   HPI Pt is here to establish care -Has not seen a PCP in a few years -Does not take any medications -States she has lost weight since taking moderna booster 11/15/19, first shot 10/16/19 -Not feeling fatigued, no SOB -good appetite, eating well -sleeping well -she denies hx of diabetes -Hx of sarcoidosis, had some skin changes that initially but has not required any treatment recently -Lives with youngest daughter, other 2 kids in [place] -stays busy around the house -Started smoking at 62yo and stopped in 1999, did smoke regularly, a pack may have lasted over a week -occasionally drink alcohol, social events -No substance use, had brothers who were addicts so she avoids this  Current Medication: Outpatient Encounter Medications as of 10/06/2022  Medication Sig   [DISCONTINUED] Biotin 24401 MCG TABS Take by mouth.   [DISCONTINUED] COLLAGEN PO Take by mouth.   [DISCONTINUED] glipiZIDE (GLUCOTROL) 10 MG tablet Take 1 tablet (10 mg total) by mouth 2 (two) times daily before a meal.   [DISCONTINUED] Multiple Vitamin (MULTIVITAMIN) tablet Take 1 tablet by mouth daily.   [DISCONTINUED] Omega 3 1200 MG CAPS Take by mouth.   [DISCONTINUED] Turmeric 500 MG CAPS Take by mouth.   [DISCONTINUED] metFORMIN (GLUCOPHAGE) 1000 MG tablet Take 0.5 tablets (500 mg total) by mouth daily with breakfast for 7 days, THEN 0.5 tablets (500 mg total) 2 (two) times daily with a meal for 7 days, THEN 1 tablet (1,000 mg total) 2 (two) times daily with a meal. (Patient taking differently: 1 Tablet 2 times Daily)   No facility-administered encounter medications on file as of 10/06/2022.    Surgical  History: History reviewed. No pertinent surgical history.  Medical History: History reviewed. No pertinent past medical history.  Family History: Family History  Problem Relation Age of Onset   Cancer Father        prostate    Social History   Socioeconomic History   Marital status: Single    Spouse name: Not on file   Number of children: Not on file   Years of education: Not on file   Highest education level: Not on file  Occupational History   Not on file  Tobacco Use   Smoking status: Former   Smokeless tobacco: Never   Tobacco comments:    quit 1999  Substance and Sexual Activity   Alcohol use: Yes    Comment: occasional   Drug use: Never   Sexual activity: Not Currently  Other Topics Concern   Not on file  Social History Narrative   Not on file   Social Determinants of Health   Financial Resource Strain: Not on file  Food Insecurity: Not on file  Transportation Needs: Not on file  Physical Activity: Not on file  Stress: Not on file  Social Connections: Not on file  Intimate Partner Violence: Not on file     Review of Systems  Vital Signs: Pulse (!) 109   Temp 98.3 F (36.8 C)   Resp 16   Ht 5\' 3"  (1.6 m)   Wt 92 lb 12.8 oz (42.1 kg)   SpO2 99%   BMI 16.44 kg/m    Physical Exam  Assessment/Plan:   General Counseling: Laura Mayer verbalizes understanding of the findings of todays visit and agrees with plan of treatment. I have discussed any further diagnostic evaluation that may be needed or ordered today. We also reviewed her medications today. she has been encouraged to call the office with any questions or concerns that should arise related to todays visit.    Counseling:    No orders of the defined types were placed in this encounter.   No orders of the defined types were placed in this encounter.    This patient was seen by Lynn Ito, PA-C in collaboration with Dr. Beverely Risen as a part of collaborative care  agreement.   Time spent:*** Minutes

## 2022-10-10 ENCOUNTER — Telehealth: Payer: Self-pay

## 2022-10-10 ENCOUNTER — Other Ambulatory Visit: Payer: Self-pay | Admitting: Internal Medicine

## 2022-10-10 LAB — CBC WITH DIFFERENTIAL/PLATELET
Basophils Absolute: 0 10*3/uL (ref 0.0–0.2)
Basos: 1 %
EOS (ABSOLUTE): 0.1 10*3/uL (ref 0.0–0.4)
Eos: 1 %
Hematocrit: 40.8 % (ref 34.0–46.6)
Hemoglobin: 13.5 g/dL (ref 11.1–15.9)
Immature Grans (Abs): 0 10*3/uL (ref 0.0–0.1)
Immature Granulocytes: 0 %
Lymphocytes Absolute: 1.2 10*3/uL (ref 0.7–3.1)
Lymphs: 22 %
MCH: 31.5 pg (ref 26.6–33.0)
MCHC: 33.1 g/dL (ref 31.5–35.7)
MCV: 95 fL (ref 79–97)
Monocytes Absolute: 0.3 10*3/uL (ref 0.1–0.9)
Monocytes: 6 %
Neutrophils Absolute: 3.7 10*3/uL (ref 1.4–7.0)
Neutrophils: 70 %
Platelets: 253 10*3/uL (ref 150–450)
RBC: 4.29 x10E6/uL (ref 3.77–5.28)
RDW: 11.3 % — ABNORMAL LOW (ref 11.7–15.4)
WBC: 5.3 10*3/uL (ref 3.4–10.8)

## 2022-10-10 LAB — TSH+FREE T4
Free T4: 1.43 ng/dL (ref 0.82–1.77)
TSH: 1.67 u[IU]/mL (ref 0.450–4.500)

## 2022-10-10 LAB — COMPREHENSIVE METABOLIC PANEL
ALT: 25 IU/L (ref 0–32)
AST: 24 IU/L (ref 0–40)
Albumin/Globulin Ratio: 1.4
Albumin: 4.1 g/dL (ref 3.9–4.9)
Alkaline Phosphatase: 98 IU/L (ref 44–121)
BUN/Creatinine Ratio: 14 (ref 12–28)
BUN: 12 mg/dL (ref 8–27)
Bilirubin Total: 0.4 mg/dL (ref 0.0–1.2)
CO2: 25 mmol/L (ref 20–29)
Calcium: 10.3 mg/dL (ref 8.7–10.3)
Chloride: 94 mmol/L — ABNORMAL LOW (ref 96–106)
Creatinine, Ser: 0.88 mg/dL (ref 0.57–1.00)
Globulin, Total: 3 g/dL (ref 1.5–4.5)
Glucose: 560 mg/dL (ref 70–99)
Potassium: 3.9 mmol/L (ref 3.5–5.2)
Sodium: 132 mmol/L — ABNORMAL LOW (ref 134–144)
Total Protein: 7.1 g/dL (ref 6.0–8.5)
eGFR: 74 mL/min/{1.73_m2} (ref >59)

## 2022-10-10 LAB — ANA W/REFLEX IF POSITIVE: Anti Nuclear Antibody (ANA): NEGATIVE

## 2022-10-10 LAB — LIPID PANEL WITH LDL/HDL RATIO
Cholesterol, Total: 147 mg/dL (ref 100–199)
HDL: 70 mg/dL (ref >39)
LDL Chol Calc (NIH): 67 mg/dL (ref 0–99)
LDL/HDL Ratio: 1 ratio (ref 0.0–3.2)
Triglycerides: 45 mg/dL (ref 0–149)
VLDL Cholesterol Cal: 10 mg/dL (ref 5–40)

## 2022-10-10 LAB — HIV ANTIBODY (ROUTINE TESTING W REFLEX): HIV Screen 4th Generation wRfx: NONREACTIVE

## 2022-10-10 LAB — HGB A1C W/O EAG: Hgb A1c MFr Bld: >15.5 % — ABNORMAL HIGH (ref 4.8–5.6)

## 2022-10-10 MED ORDER — ACCU-CHEK GUIDE ME W/DEVICE KIT: PACK | 0 refills | Status: AC

## 2022-10-10 MED ORDER — GLIMEPIRIDE 2 MG PO TABS
ORAL_TABLET | ORAL | 1 refills | Status: DC
Start: 2022-10-10 — End: 2022-11-13

## 2022-10-10 MED ORDER — ACCU-CHEK GUIDE VI STRP: ORAL_STRIP | 12 refills | Status: AC

## 2022-10-10 MED ORDER — ACCU-CHEK SOFTCLIX LANCETS MISC: 12 refills | Status: AC

## 2022-10-10 NOTE — Telephone Encounter (Signed)
Spoke with pt due to [facility] called for critical labs as per DFK that  her glucose is 560  and we like to see her in office today she refused to come in and also she is not checking glucose daily advised her I talk to [facility] and let her know what next

## 2022-10-10 NOTE — Progress Notes (Signed)
Spoke with pt, she is willing to try oral medications Rx sent

## 2022-11-13 ENCOUNTER — Other Ambulatory Visit: Payer: Self-pay

## 2022-11-13 MED ORDER — GLIMEPIRIDE 2 MG PO TABS: ORAL_TABLET | ORAL | 1 refills | Status: AC

## 2022-11-17 ENCOUNTER — Telehealth: Payer: Medicaid Other | Admitting: Physician Assistant

## 2022-11-17 ENCOUNTER — Encounter: Payer: Self-pay | Admitting: Physician Assistant

## 2022-11-17 VITALS — Resp 16 | Ht 63.0 in

## 2022-11-17 DIAGNOSIS — E1165 Type 2 diabetes mellitus with hyperglycemia: Secondary | ICD-10-CM

## 2022-11-17 DIAGNOSIS — R634 Abnormal weight loss: Secondary | ICD-10-CM | POA: Diagnosis not present

## 2022-11-17 NOTE — Progress Notes (Signed)
[facility] [address] [place], [place] [address]  Internal MEDICINE  Telephone Visit  Patient Name: Laura Mayer  [id number]  [id number]  Date of Service: 11/17/2022  I connected with the patient at 9:26 by telephone and verified the patients identity using two identifiers.   I discussed the limitations, risks, security and privacy concerns of performing an evaluation and management service by telephone and the availability of in person appointments. I also discussed with the patient that there may be a patient responsible charge related to the service.  The patient expressed understanding and agrees to proceed.    Chief Complaint  Patient presents with   Telephone Screen    F/u lab review    Telephone Assessment    HPI Pt is here for routine follow up to review labs and monitor BG. Pt unable to find a ride -she states she was fired from her job and is now unemployed and lost place of residence. Stayed at her daughter's house last night. -Couldn't get meter/kit due to cost and has been trying to touch base with medicaid rep about coverage -Taking glimepiride BID. No side effects with it. Thinks she is feeling better on this. Since unable to check suagrs will hold off with further medication adjustment at this time.  -Found local PCP in [place] via [facility] and will send request for records. Seeing him today at 1:40 actually and can therefore have her sugar checked and meds adjusted as indicated. -Does not have scale currently to monitor her weight as weight loss had been main concern and may have been due to very uncontrolled diabetes. Other labs looked ok  Current Medication: Outpatient Encounter Medications as of 11/17/2022  Medication Sig   Accu-Chek Softclix Lancets lancets Check blood sugar once a day and prn for E11.65   Blood Glucose Monitoring Suppl (ACCU-CHEK GUIDE ME) w/Device KIT Use as directed ( e11.65)   glimepiride (AMARYL) 2 MG tablet Take one tab po in am  with breakfast and one tab before supper   glucose blood (ACCU-CHEK GUIDE) test strip Check sugar once a day and prn for E11.65   No facility-administered encounter medications on file as of 11/17/2022.    Surgical History: History reviewed. No pertinent surgical history.  Medical History: History reviewed. No pertinent past medical history.  Family History: Family History  Problem Relation Age of Onset   Cancer Father        prostate    Social History   Socioeconomic History   Marital status: Single    Spouse name: Not on file   Number of children: Not on file   Years of education: Not on file   Highest education level: Not on file  Occupational History   Not on file  Tobacco Use   Smoking status: Former   Smokeless tobacco: Never   Tobacco comments:    quit 1999  Substance and Sexual Activity   Alcohol use: Yes    Comment: occasional   Drug use: Never   Sexual activity: Not Currently  Other Topics Concern   Not on file  Social History Narrative   Not on file   Social Determinants of Health   Financial Resource Strain: Not on file  Food Insecurity: Not on file  Transportation Needs: Not on file  Physical Activity: Not on file  Stress: Not on file  Social Connections: Not on file  Intimate Partner Violence: Not on file      Review of Systems  Constitutional:  Negative for chills, fatigue and unexpected weight change.  HENT:  Negative for congestion, postnasal drip, rhinorrhea, sneezing and sore throat.   Eyes:  Negative for redness.  Respiratory:  Negative for cough, chest tightness and shortness of breath.   Cardiovascular:  Negative for chest pain and palpitations.  Gastrointestinal:  Negative for abdominal pain, constipation, diarrhea, nausea and vomiting.  Genitourinary:  Negative for dysuria and frequency.  Musculoskeletal:  Negative for arthralgias, back pain, joint swelling and neck pain.  Skin:  Negative for rash.  Neurological: Negative.   Negative for tremors and numbness.  Hematological:  Negative for adenopathy. Does not bruise/bleed easily.  Psychiatric/Behavioral:  Negative for behavioral problems (Depression), sleep disturbance and suicidal ideas. The patient is not nervous/anxious.     Vital Signs: Resp 16   Ht 5\' 3"  (1.6 m)   BMI 16.44 kg/m    Observation/Objective:  Pt is able to carry out conversation   Assessment/Plan: 1. Type 2 diabetes mellitus with hyperglycemia, without long-term current use of insulin (HCC) Very uncontrolled diabetes and was started on glimepiride BID upon lab result which has helped, but unfortunately unable to monitor BG at home due to meter cost/coverage concerns. She is contacting insurance about meter options. She has appt with new PCP local to her later today and will address further.  2. Abnormal weight loss Likely secondary to DM, Unable to track weight recently and will have this checked at new PCP visit later today and have them continue to monitor   General Counseling: Laura Mayer verbalizes understanding of the findings of today's phone visit and agrees with plan of treatment. I have discussed any further diagnostic evaluation that may be needed or ordered today. We also reviewed her medications today. she has been encouraged to call the office with any questions or concerns that should arise related to todays visit.    No orders of the defined types were placed in this encounter.   No orders of the defined types were placed in this encounter.   Time spent:30 Minutes    Dr Lyndon Code Internal medicine

## 2023-02-21 ENCOUNTER — Telehealth: Payer: Self-pay | Admitting: Physician Assistant

## 2023-02-21 NOTE — Telephone Encounter (Signed)
08/13/21-current MR faxed to [facility]; [phone]
